# Patient Record
Sex: Male | Born: 1970 | Race: White | Hispanic: No | Marital: Single | Smoking: Current every day smoker
Health system: Southern US, Community
[De-identification: ages and names within clinical notes are randomized; demographics above are authoritative.]

## PROBLEM LIST (undated history)

## (undated) DIAGNOSIS — N289 Disorder of kidney and ureter, unspecified: Secondary | ICD-10-CM

## (undated) DIAGNOSIS — E119 Type 2 diabetes mellitus without complications: Secondary | ICD-10-CM

## (undated) HISTORY — PX: TONSILLECTOMY: SUR1361

---

## 2016-10-26 ENCOUNTER — Encounter (HOSPITAL_COMMUNITY): Payer: Self-pay

## 2016-10-26 ENCOUNTER — Emergency Department (HOSPITAL_COMMUNITY): Payer: Self-pay

## 2016-10-26 ENCOUNTER — Emergency Department (HOSPITAL_COMMUNITY)
Admission: EM | Admit: 2016-10-26 | Discharge: 2016-10-26 | Disposition: A | Discharge status: Home / Self Care | Payer: Self-pay | Attending: Emergency Medicine | Admitting: Emergency Medicine

## 2016-10-26 DIAGNOSIS — E119 Type 2 diabetes mellitus without complications: Secondary | ICD-10-CM | POA: Insufficient documentation

## 2016-10-26 DIAGNOSIS — R339 Retention of urine, unspecified: Secondary | ICD-10-CM | POA: Insufficient documentation

## 2016-10-26 DIAGNOSIS — Z79899 Other long term (current) drug therapy: Secondary | ICD-10-CM | POA: Insufficient documentation

## 2016-10-26 DIAGNOSIS — N200 Calculus of kidney: Secondary | ICD-10-CM | POA: Insufficient documentation

## 2016-10-26 DIAGNOSIS — F17210 Nicotine dependence, cigarettes, uncomplicated: Secondary | ICD-10-CM | POA: Insufficient documentation

## 2016-10-26 HISTORY — DX: Disorder of kidney and ureter, unspecified: N28.9

## 2016-10-26 HISTORY — DX: Type 2 diabetes mellitus without complications: E11.9

## 2016-10-26 LAB — COMPREHENSIVE METABOLIC PANEL
ALBUMIN: 4.7 g/dL (ref 3.5–5.0)
ALK PHOS: 89 U/L (ref 38–126)
ALT: 24 U/L (ref 17–63)
ANION GAP: 9 (ref 5–15)
AST: 27 U/L (ref 15–41)
BILIRUBIN TOTAL: 0.6 mg/dL (ref 0.3–1.2)
BUN: 18 mg/dL (ref 6–20)
CALCIUM: 9.4 mg/dL (ref 8.9–10.3)
CO2: 23 mmol/L (ref 22–32)
Chloride: 108 mmol/L (ref 101–111)
Creatinine, Ser: 1.31 mg/dL — ABNORMAL HIGH (ref 0.61–1.24)
GLUCOSE: 191 mg/dL — AB (ref 65–99)
POTASSIUM: 4.4 mmol/L (ref 3.5–5.1)
Sodium: 140 mmol/L (ref 135–145)
TOTAL PROTEIN: 7.3 g/dL (ref 6.5–8.1)

## 2016-10-26 LAB — CBC WITH DIFFERENTIAL/PLATELET
BASOS ABS: 0 10*3/uL (ref 0.0–0.1)
BASOS PCT: 0 %
Eosinophils Absolute: 0 10*3/uL (ref 0.0–0.7)
Eosinophils Relative: 0 %
HEMATOCRIT: 44.9 % (ref 39.0–52.0)
Hemoglobin: 15.7 g/dL (ref 13.0–17.0)
LYMPHS PCT: 9 %
Lymphs Abs: 1.4 10*3/uL (ref 0.7–4.0)
MCH: 32 pg (ref 26.0–34.0)
MCHC: 35 g/dL (ref 30.0–36.0)
MCV: 91.6 fL (ref 78.0–100.0)
Monocytes Absolute: 0.5 10*3/uL (ref 0.1–1.0)
Monocytes Relative: 3 %
NEUTROS ABS: 12.7 10*3/uL — AB (ref 1.7–7.7)
Neutrophils Relative %: 88 %
PLATELETS: 189 10*3/uL (ref 150–400)
RBC: 4.9 MIL/uL (ref 4.22–5.81)
RDW: 12.6 % (ref 11.5–15.5)
WBC: 14.5 10*3/uL — AB (ref 4.0–10.5)

## 2016-10-26 LAB — URINALYSIS, ROUTINE W REFLEX MICROSCOPIC
BILIRUBIN URINE: NEGATIVE
Bacteria, UA: NONE SEEN
GLUCOSE, UA: NEGATIVE mg/dL
KETONES UR: 5 mg/dL — AB
Nitrite: NEGATIVE
PH: 5 (ref 5.0–8.0)
Protein, ur: NEGATIVE mg/dL
SPECIFIC GRAVITY, URINE: 1.018 (ref 1.005–1.030)
Squamous Epithelial / LPF: NONE SEEN

## 2016-10-26 MED ORDER — KETOROLAC TROMETHAMINE 30 MG/ML IJ SOLN
30.0000 mg | Freq: Once | INTRAMUSCULAR | Status: AC
  Administered 2016-10-26: 30 mg via INTRAVENOUS
  Filled 2016-10-26: qty 1

## 2016-10-26 MED ORDER — TAMSULOSIN HCL 0.4 MG PO CAPS: 0.4000 mg | ORAL_CAPSULE | Freq: Every day | ORAL | 0 refills | Status: AC

## 2016-10-26 MED ORDER — HYDROCODONE-ACETAMINOPHEN 5-325 MG PO TABS: 1.0000 | ORAL_TABLET | ORAL | 0 refills | Status: AC | PRN

## 2016-10-26 NOTE — ED Triage Notes (Signed)
Patient states he has been having intermittent left flank pain that radiates into the left groin and left scrotum. Patient has only voided x 1 today. Patient has a history of urinary retention.

## 2016-10-26 NOTE — ED Provider Notes (Signed)
[facility]-EMERGENCY DEPT Provider Note   CSN: [id number] Arrival date & time: 10/26/16  1434     History   Chief Complaint Chief Complaint  Patient presents with  . Flank Pain    HPI Frederick Romero is a 46 y.o. male.  HPI   Frederick Romero is a 46 y.o. male, with a history of DM managed with diet, presenting to the ED with left flank pain beginning suddenly around 11 AM this morning. Accompanied by decreased urine output after pain began. Normal urination prior to pain onset. Pain is dull, waxing and waning, 5-6/10 currently, intermittently at 10/10, radiates into left groin and testicle. Has had similar presentation with previous kidney stone in 2010. This stone passed spontaneously. Is not followed by a urologist, but has an appointment set up with Raelyn Number at [facility] on Monday, June 25.  Denies N/V/D, fever/chills, hematuria, dysuria, penile discharge, or any other complaints.   Past Medical History:  Diagnosis Date  . Diabetes mellitus without complication (HCC)   . Renal disorder     There are no active problems to display for this patient.   Past Surgical History:  Procedure Laterality Date  . TONSILLECTOMY         Home Medications    Prior to Admission medications   Medication Sig Start Date End Date Taking? Authorizing Provider  Aspirin-Salicylamide-Caffeine (BC HEADACHE POWDER PO) Take 2 packets by mouth daily as needed (headache).   Yes [provider]  naproxen sodium (ANAPROX) 220 MG tablet Take 3-4 mg by mouth 2 (two) times daily with a meal.   Yes [provider]  HYDROcodone-acetaminophen (NORCO/VICODIN) 5-325 MG tablet Take 1-2 tablets by mouth every 4 (four) hours as needed for severe pain. 10/26/16   Fusae Florio C, PA-C  tamsulosin (FLOMAX) 0.4 MG CAPS capsule Take 1 capsule (0.4 mg total) by mouth daily. 10/26/16   Lurdes Haltiwanger, Hillard Danker, PA-C    Family History No family history on file.  Social History Social History  Substance Use Topics  .  Smoking status: Current Every Day Smoker    Packs/day: 1.00    Types: Cigarettes  . Smokeless tobacco: Never Used  . Alcohol use No     Allergies   Codeine   Review of Systems Review of Systems  Constitutional: Negative for chills, diaphoresis and fever.  Respiratory: Negative for shortness of breath.   Cardiovascular: Negative for chest pain.  Gastrointestinal: Negative for blood in stool, diarrhea, nausea and vomiting.  Genitourinary: Positive for difficulty urinating and flank pain. Negative for dysuria and hematuria.  Neurological: Negative for dizziness, weakness, light-headedness and numbness.  All other systems reviewed and are negative.    Physical Exam Updated Vital Signs BP (!) 166/99 (BP Location: Left Arm)   Pulse (!) 50   Temp 97.5 F (36.4 C) (Oral)   Resp 18   Ht 5\' 9"  (1.753 m)   Wt 78.9 kg (174 lb)   SpO2 100%   BMI 25.70 kg/m   Physical Exam  Constitutional: He appears well-developed and well-nourished. No distress.  HENT:  Head: Normocephalic and atraumatic.  Eyes: Conjunctivae are normal.  Neck: Neck supple.  Cardiovascular: Normal rate, regular rhythm, normal heart sounds and intact distal pulses.   Pulmonary/Chest: Effort normal and breath sounds normal. No respiratory distress.  Abdominal: Soft. There is tenderness in the left lower quadrant. There is CVA tenderness (left). There is no guarding.  Genitourinary: Circumcised.  Genitourinary Comments: Penis, scrotum, and testicles without swelling, lesions, or tenderness.  No penile discharge.  No inguinal hernia noted. Cremasteric reflex intact. Overall normal male genitalia.   Musculoskeletal: He exhibits no edema.  Lymphadenopathy:    He has no cervical adenopathy.  Neurological: He is alert.  Skin: Skin is warm and dry. He is not diaphoretic.  Psychiatric: He has a normal mood and affect. His behavior is normal.  Nursing note and vitals reviewed.    ED Treatments / Results  Labs (all  labs ordered are listed, but only abnormal results are displayed) Labs Reviewed  URINALYSIS, ROUTINE W REFLEX MICROSCOPIC - Abnormal; Notable for the following:       Result Value   Hgb urine dipstick LARGE (*)    Ketones, ur 5 (*)    Leukocytes, UA TRACE (*)    All other components within normal limits  COMPREHENSIVE METABOLIC PANEL - Abnormal; Notable for the following:    Glucose, Bld 191 (*)    Creatinine, Ser 1.31 (*)    All other components within normal limits  CBC WITH DIFFERENTIAL/PLATELET - Abnormal; Notable for the following:    WBC 14.5 (*)    Neutro Abs 12.7 (*)    All other components within normal limits    EKG  EKG Interpretation None       Radiology Ct Renal Stone Study  Result Date: 10/26/2016 CLINICAL DATA:  Acute onset of intermittent left flank pain, radiating to the left groin and left scrotum. Initial encounter. EXAM: CT ABDOMEN AND PELVIS WITHOUT CONTRAST TECHNIQUE: Multidetector CT imaging of the abdomen and pelvis was performed following the standard protocol without IV contrast. COMPARISON:  None. FINDINGS: Lower chest: Mild coronary artery calcification is noted. The visualized lung bases are grossly clear. Hepatobiliary: The liver is unremarkable in appearance. The gallbladder is unremarkable in appearance. The common bile duct remains normal in caliber. Pancreas: The pancreas is within normal limits. Spleen: The spleen is unremarkable in appearance. Adrenals/Urinary Tract: The adrenal glands are unremarkable in appearance. Minimal left-sided hydronephrosis is noted, with prominence of the left ureter along its entire course. An obstructing 4 mm stone is noted distally at the left vesicoureteral junction. The right kidney is unremarkable in appearance. No nonobstructing renal stones are identified. No perinephric stranding is appreciated. Stomach/Bowel: The stomach is unremarkable in appearance. The small bowel is within normal limits. The appendix is normal  in caliber, without evidence of appendicitis. The colon is unremarkable in appearance. Vascular/Lymphatic: Scattered calcification is seen along the abdominal aorta and its branches. The abdominal aorta is otherwise grossly unremarkable. The inferior vena cava is grossly unremarkable. No retroperitoneal lymphadenopathy is seen. No pelvic sidewall lymphadenopathy is identified. Reproductive: The bladder is decompressed, with a Foley catheter in place. Air is noted within the bladder. A small urachal remnant is incidentally seen. The prostate remains normal in size. Other: No additional soft tissue abnormalities are seen. Musculoskeletal: No acute osseous abnormalities are identified. 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 There is mild grade 1 retrolisthesis of L4 on L5. The visualized musculature is unremarkable in appearance. IMPRESSION: 1. Minimal left-sided hydronephrosis, with diffuse prominence of the left ureter. Obstructing 4 mm stone noted distally at the left vesicoureteral junction. 2. Chronic bilateral pars defects at L5, with grade 1 anterolisthesis of L5 on S1. Mild grade 1 retrolisthesis of L4 on L 5. 3. Scattered aortic atherosclerosis. Electronically Signed   By: Roanna Raider M.D.   On: 10/26/2016 16:46  Procedures Procedures (including critical care time)   Medications Ordered in ED Medications  ketorolac (TORADOL) 30 MG/ML injection 30 mg (30 mg Intravenous Given 10/26/16 1603)     Initial Impression / Assessment and Plan / ED Course  I have reviewed the triage vital signs and the nursing notes.  Pertinent labs & imaging results that were available during my care of the patient were reviewed by me and considered in my medical decision making (see chart for details).  Clinical Course as of Oct 28 1599  Thu Oct 26, 2016  1630 Patient is not aware of what his baseline creatinine is. Creatinine:  (!) 1.31 [SJ]  1717 Patient remains symptom-free.   [SJ]    Clinical Course User Index [SJ] Jahara Dail C, PA-C    Patient presents with flank pain. 4 mm stone at the UVJ. Patient is nontoxic appearing, afebrile, not tachycardic, not tachypneic, not hypotensive, maintains SPO2 of 100% on room air, and is in no apparent distress. Once patient's pain was resolved, he remained pain free throughout ED course. Patient already has urology follow-up scheduled. The patient was given instructions for home care as well as return precautions. Patient voices understanding of these instructions, accepts the plan, and is comfortable with discharge.    Findings and plan of care discussed with Alvira MondayErin Schlossman, MD.    Foley catheter placed due to complaint of urinary retention and PVR of at least 250cc. Experienced complete relief of discomfort following Foley placement. Given options of leaving foley in place until urology follow up or doing a void trial here in the ED. Patient chose a void trial. Patient was able to urinate without difficulty.  Vitals:   10/26/16 1447 10/26/16 1809  BP: (!) 166/99 131/78  Pulse: (!) 50 69  Resp: 18 18  Temp: 97.5 F (36.4 C)   TempSrc: Oral   SpO2: 100% 100%  Weight: 78.9 kg (174 lb)   Height: 5\' 9"  (1.753 m)      Final Clinical Impressions(s) / ED Diagnoses   Final diagnoses:  Kidney stone    New Prescriptions Discharge Medication List as of 10/26/2016  6:07 PM    START taking these medications   Details  HYDROcodone-acetaminophen (NORCO/VICODIN) 5-325 MG tablet Take 1-2 tablets by mouth every 4 (four) hours as needed for severe pain., Starting Thu 10/26/2016, Print    tamsulosin (FLOMAX) 0.4 MG CAPS capsule Take 1 capsule (0.4 mg total) by mouth daily., Starting Thu 10/26/2016, Print         Elya Diloreto C, PA-C 10/27/16 1601    Alvira MondaySchlossman, Erin, MD 10/28/16 0700

## 2016-10-26 NOTE — Discharge Instructions (Signed)
You have been seen today for flank pain. You have evidence of a kidney stone almost into the bladder on the left. Stay well hydrated. Use tylenol as needed for pain. Vicodin as needed for severe pain. Do not drive or perform other dangerous activities while taking the vicodin. Note that each vicodin pill contains 325mg  of tylenol. Limit total daily tylenol intake to 4000mg  maximum.  Take the flomax daily. Strain all urine.  Follow up with urology, as planned, on Monday.

## 2016-10-26 NOTE — Care Management (Signed)
ED CM spoke with the patient at the bedside. [facility] but [facility] has moved. [place]. [place] Rubie Maidrystal [employer] Null RN CCM

## 2018-05-29 IMAGING — CT CT RENAL STONE PROTOCOL
2 of 3 series · 15 of 46 positions shown, 17 images · non-contrast
Comparison: None.

CLINICAL DATA: Acute onset of intermittent left flank pain,
radiating to the left groin and left scrotum. Initial encounter.

EXAM:
CT ABDOMEN AND PELVIS WITHOUT CONTRAST
TECHNIQUE: Multidetector CT imaging of the abdomen and pelvis was performed
following the standard protocol without IV contrast.

[Series 3: lung · axial · 0.74mm/px · z∈[-89,+9]mm · 12 of 57 slices shown, 14 images]
[im 4/57  soft-tissue]
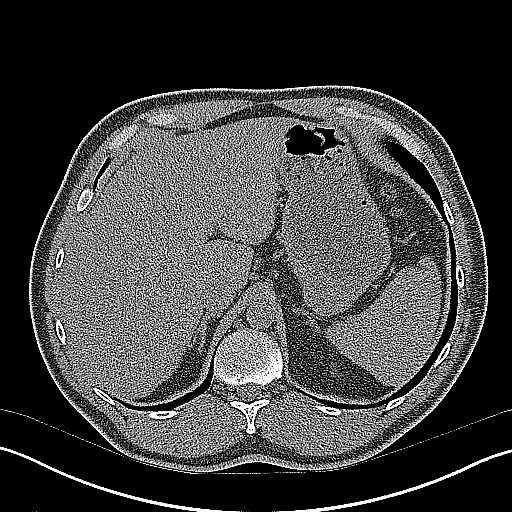
[im 4/57  bone]
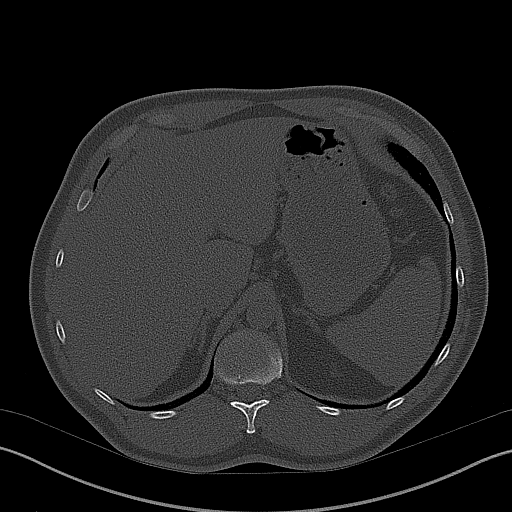
[im 8/57  soft-tissue]
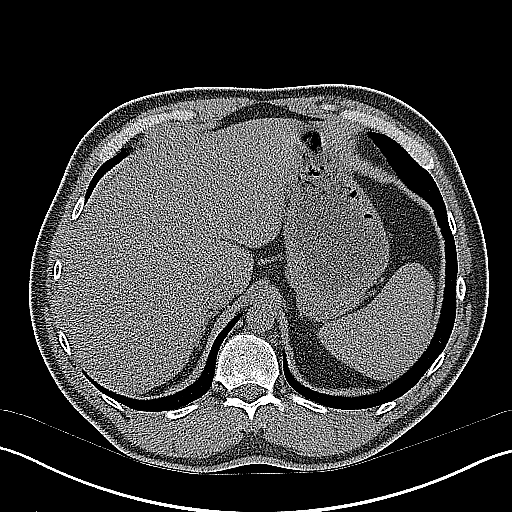
[im 13/57  soft-tissue]
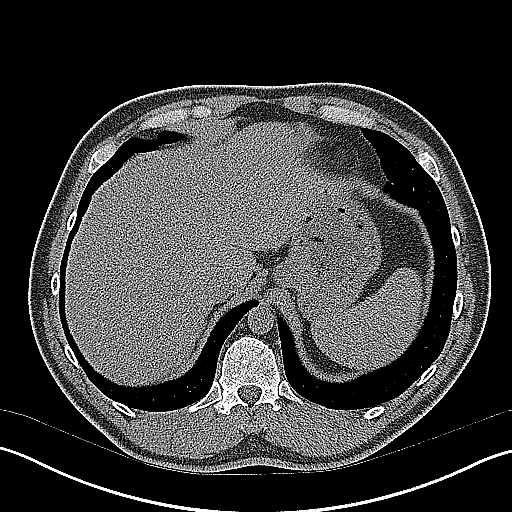
[im 17/57  soft-tissue]
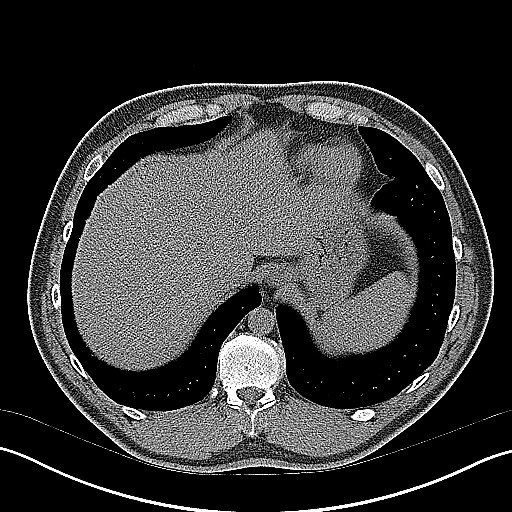
[im 22/57  soft-tissue]
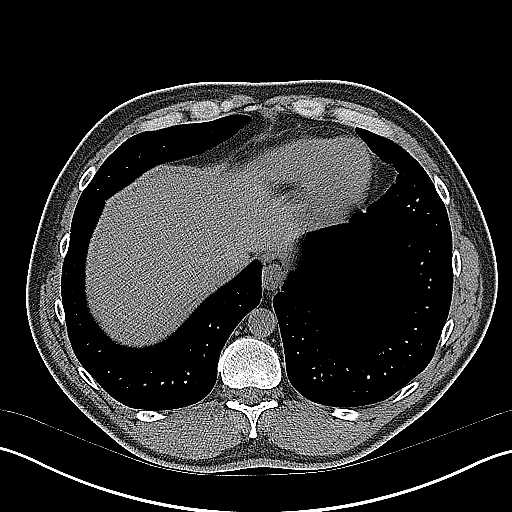
[im 26/57  soft-tissue]
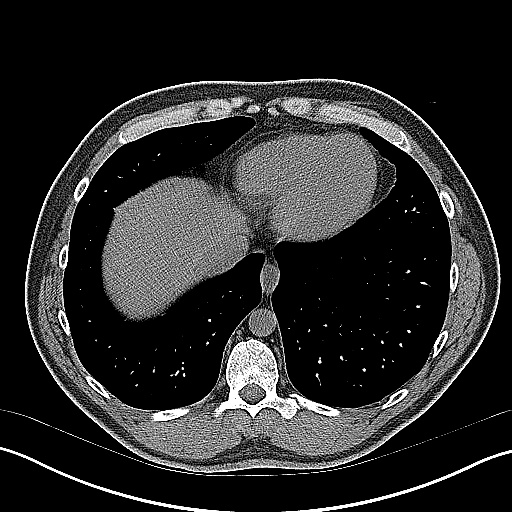
[im 31/57  soft-tissue]
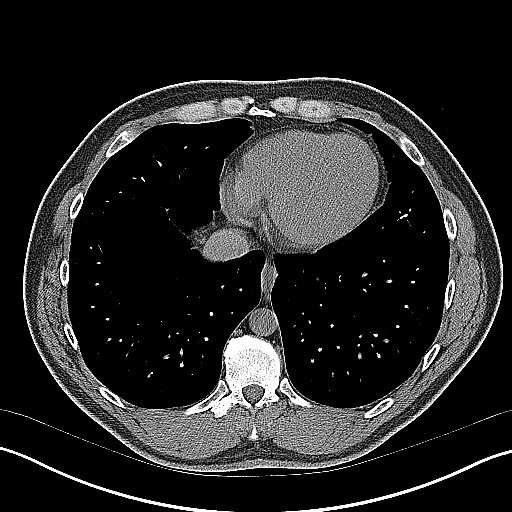
[im 35/57  soft-tissue]
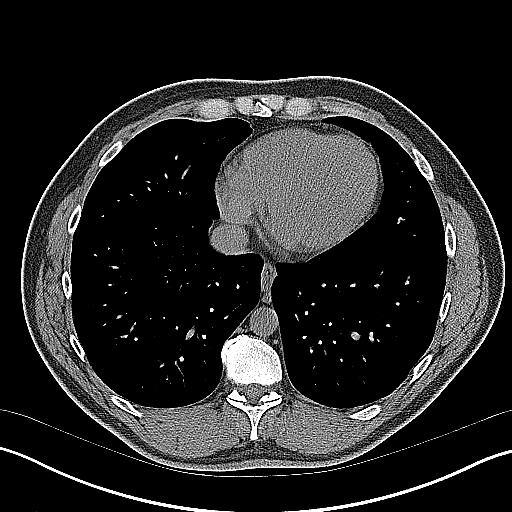
[im 40/57  soft-tissue]
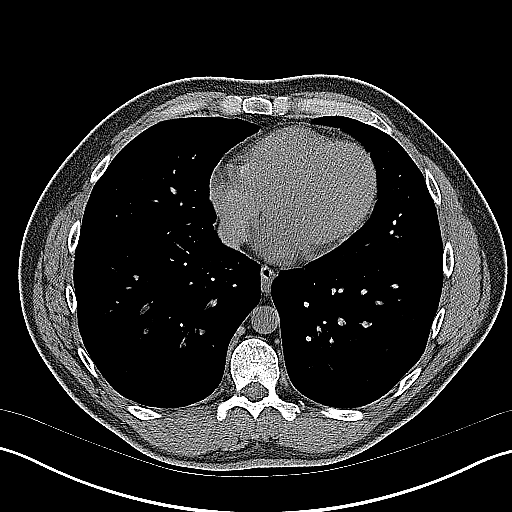
[im 40/57  bone]
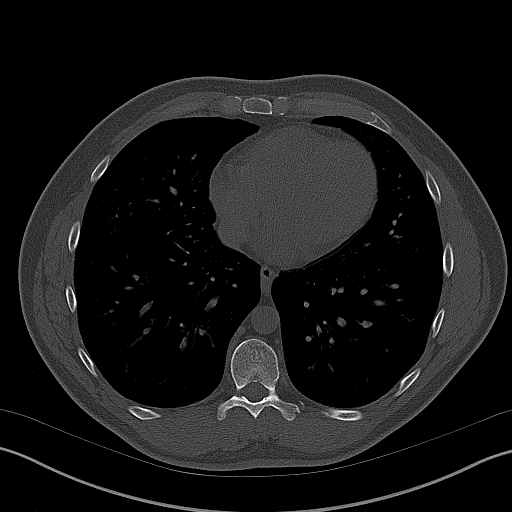
[im 44/57  soft-tissue]
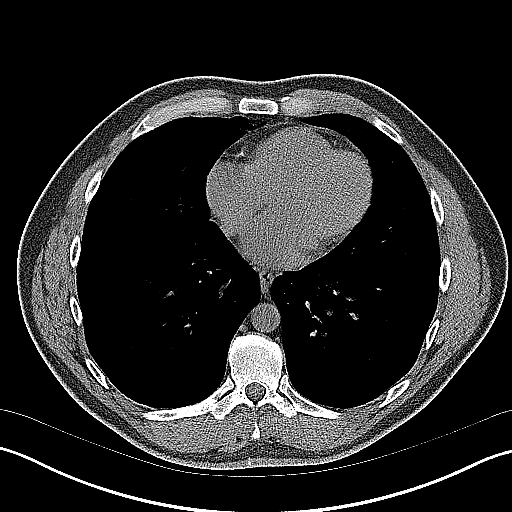
[im 49/57  soft-tissue]
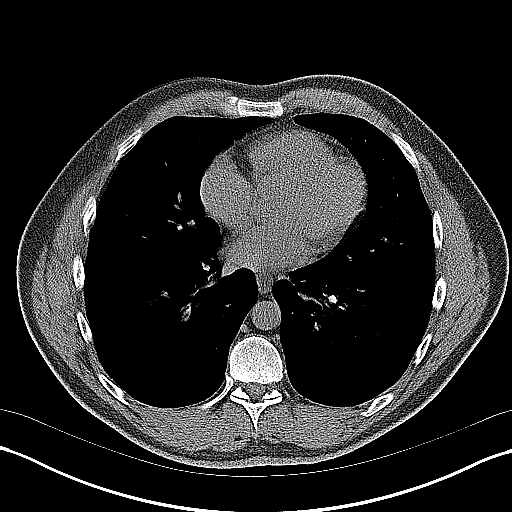
[im 53/57  soft-tissue]
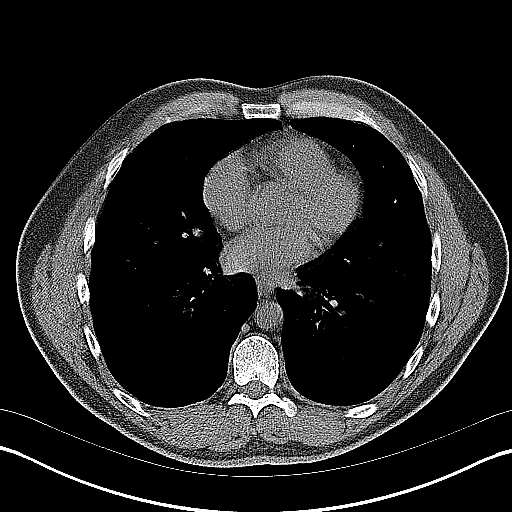

[Series 4: coronal · coronal · 0.74mm/px · 3 of 141 slices shown]
[im 47/141  soft-tissue]
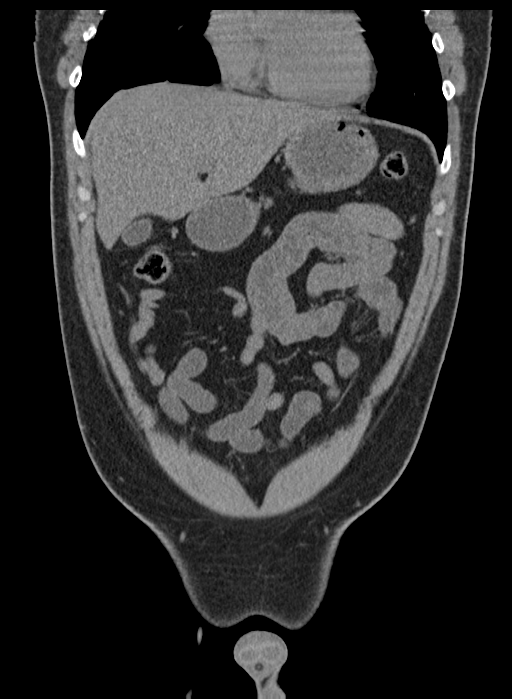
[im 63/141  soft-tissue]
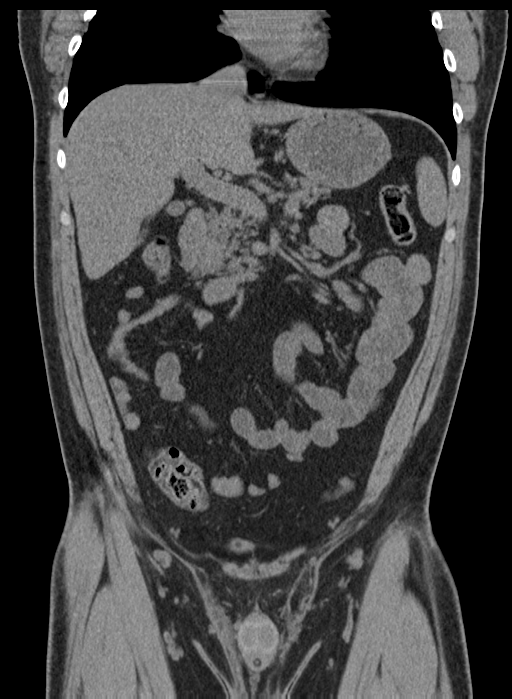
[im 78/141  soft-tissue]
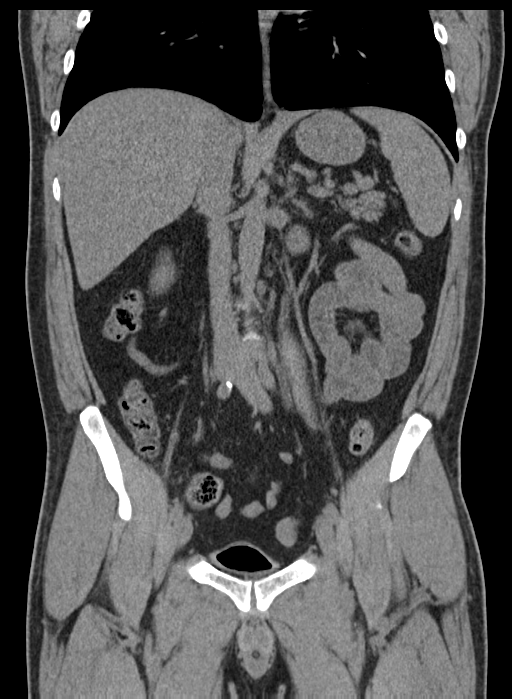

[15 of 46 positions shown; findings below may reference images not displayed]

FINDINGS: Lower chest: Mild coronary artery calcification is noted. The
visualized lung bases are grossly clear.

Hepatobiliary: The liver is unremarkable in appearance. The
gallbladder is unremarkable in appearance. The common bile duct
remains normal in caliber.

Pancreas: The pancreas is within normal limits.

Spleen: The spleen is unremarkable in appearance.

Adrenals/Urinary Tract: The adrenal glands are unremarkable in
appearance.

Minimal left-sided hydronephrosis is noted, with prominence of the
left ureter along its entire course. An obstructing 4 mm stone is
noted distally at the left vesicoureteral junction.

The right kidney is unremarkable in appearance. No nonobstructing
renal stones are identified. No perinephric stranding is
appreciated.

Stomach/Bowel: The stomach is unremarkable in appearance. The small
bowel is within normal limits. The appendix is normal in caliber,
without evidence of appendicitis. The colon is unremarkable in
appearance.

Vascular/Lymphatic: Scattered calcification is seen along the
abdominal aorta and its branches. The abdominal aorta is otherwise
grossly unremarkable. The inferior vena cava is grossly
unremarkable. No retroperitoneal lymphadenopathy is seen. No pelvic
sidewall lymphadenopathy is identified.

Reproductive: The bladder is decompressed, with a Foley catheter in
place. Air is noted within the bladder. A small urachal remnant is
incidentally seen. The prostate remains normal in size.

Other: No additional soft tissue abnormalities are seen.

Musculoskeletal: No acute osseous abnormalities are identified.
Chronic bilateral pars defects are seen at L5, with grade 1
anterolisthesis of L5 on S1, and underlying vacuum phenomenon. There
is mild grade 1 retrolisthesis of L4 on L5. The visualized
musculature is unremarkable in appearance.
IMPRESSION: 1. Minimal left-sided hydronephrosis, with diffuse prominence of the
left ureter. Obstructing 4 mm stone noted distally at the left
vesicoureteral junction.
2. Chronic bilateral pars defects at L5, with grade 1
anterolisthesis of L5 on S1. Mild grade 1 retrolisthesis of L4 on L
5.
3. Scattered aortic atherosclerosis.
